# Patient Record
Sex: Male | Born: 1990 | Race: Black or African American | Hispanic: No | Marital: Single | Smoking: Never smoker
Health system: Southern US, Community
[De-identification: ages and names within clinical notes are randomized; demographics above are authoritative.]

---
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
## 2009-03-05 ENCOUNTER — Ambulatory Visit (HOSPITAL_BASED_OUTPATIENT_CLINIC_OR_DEPARTMENT_OTHER): Admission: RE | Admit: 2009-03-05 | Discharge: 2009-03-05 | Payer: Self-pay | Admitting: Ophthalmology

## 2009-10-17 IMAGING — CR DG ANKLE COMPLETE 3+V*L*
3 series · 3 of 3 positions shown · non-contrast
Comparison: Ankle films 04/26/2004.

CLINICAL DATA: Injured, playing football.  Lateral ankle pain

LEFT ANKLE COMPLETE - 3+ VIEW

[view not recorded (1 of 3)]
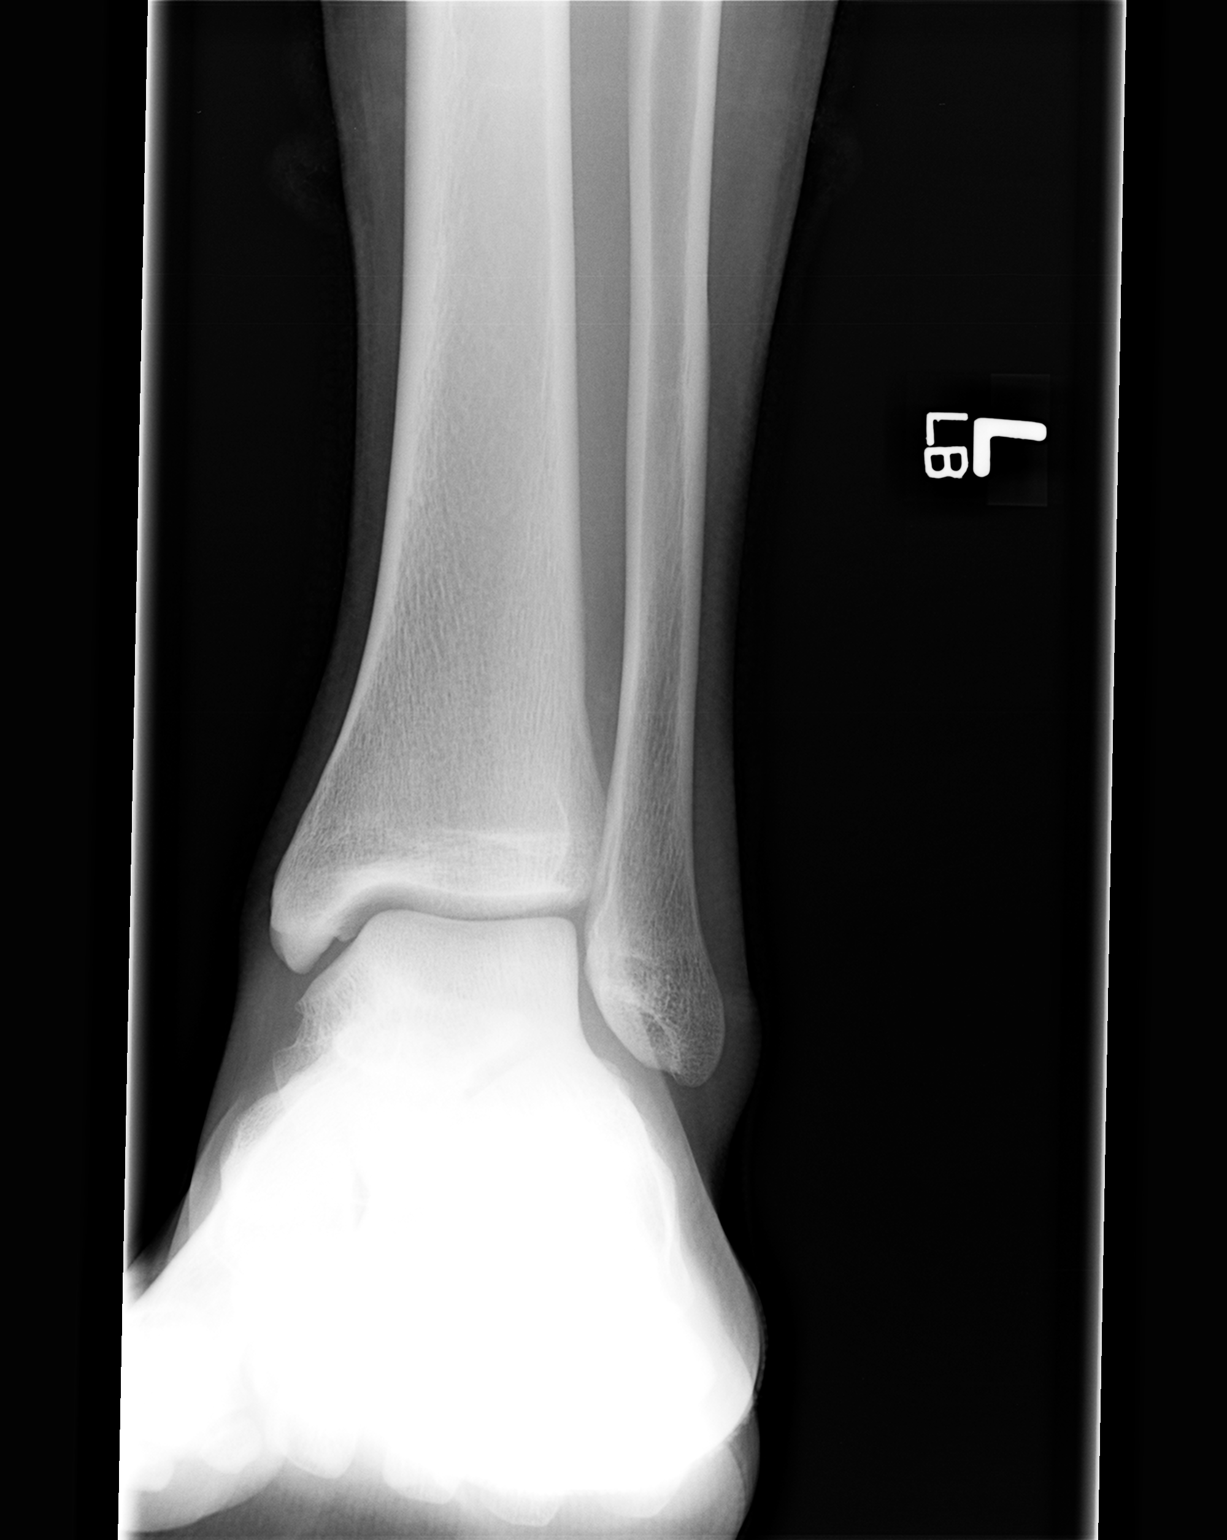

[view not recorded (2 of 3)]
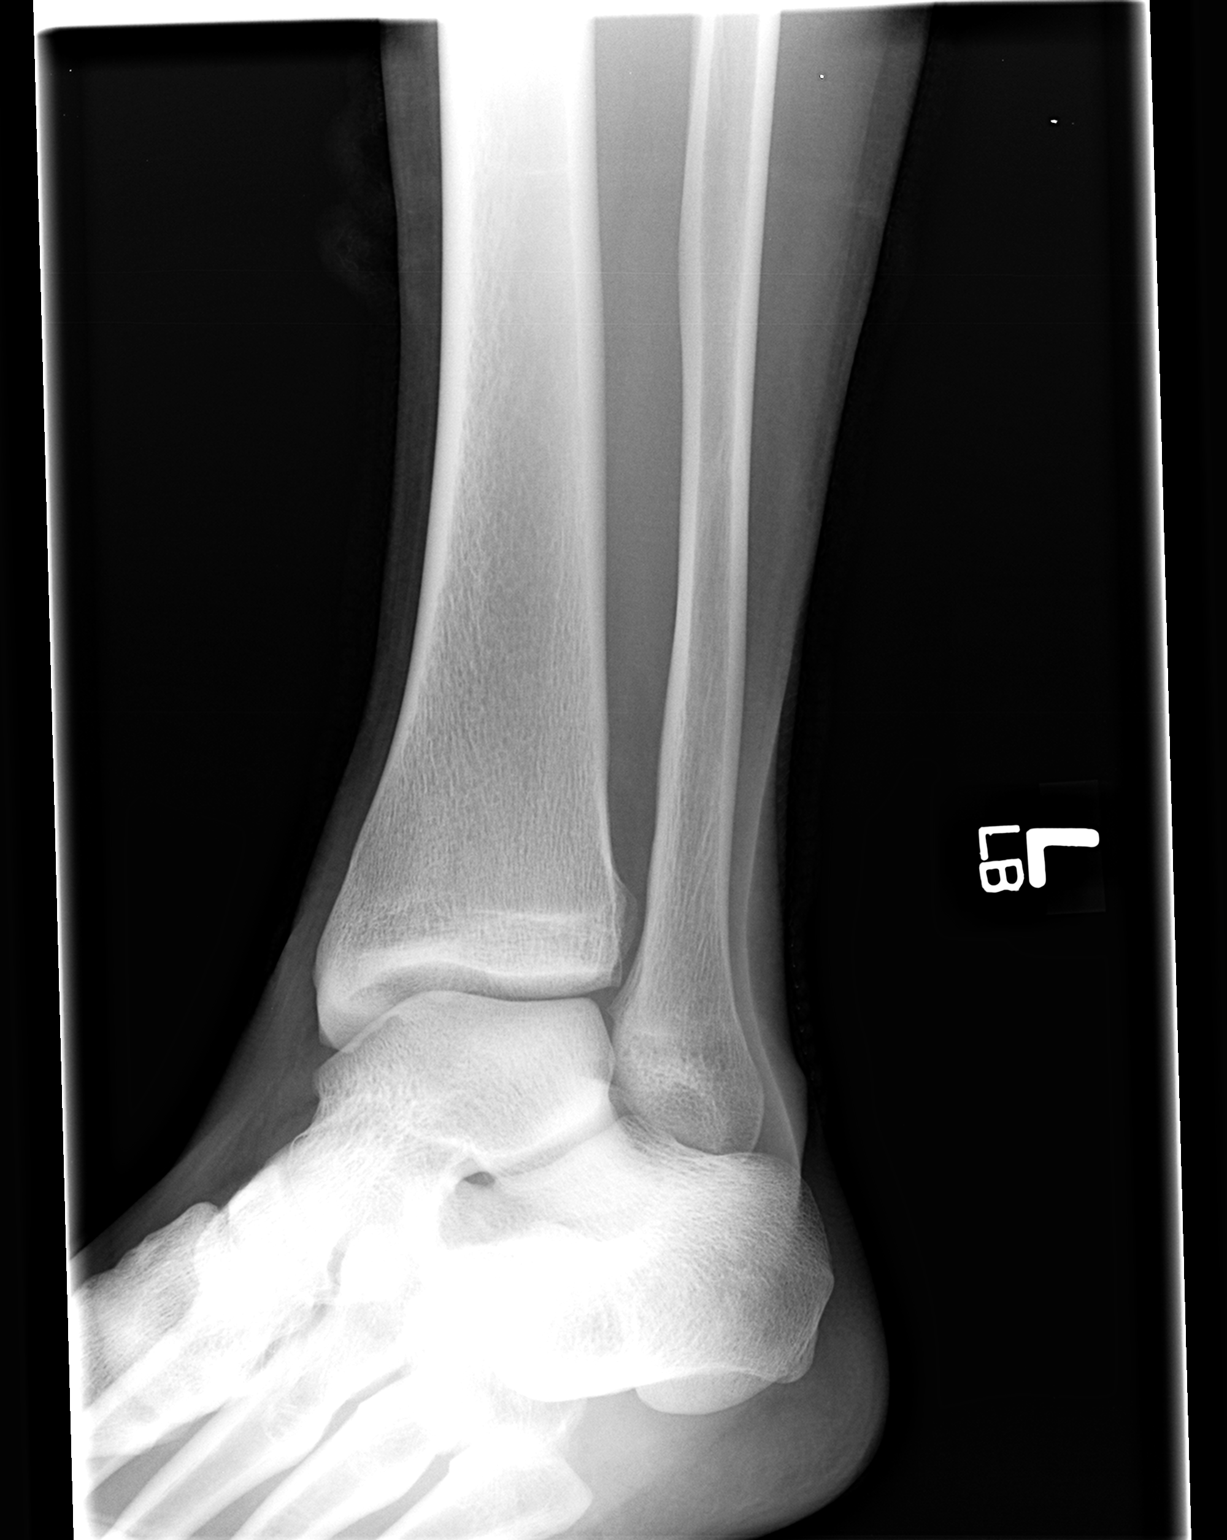

[view not recorded (3 of 3)]
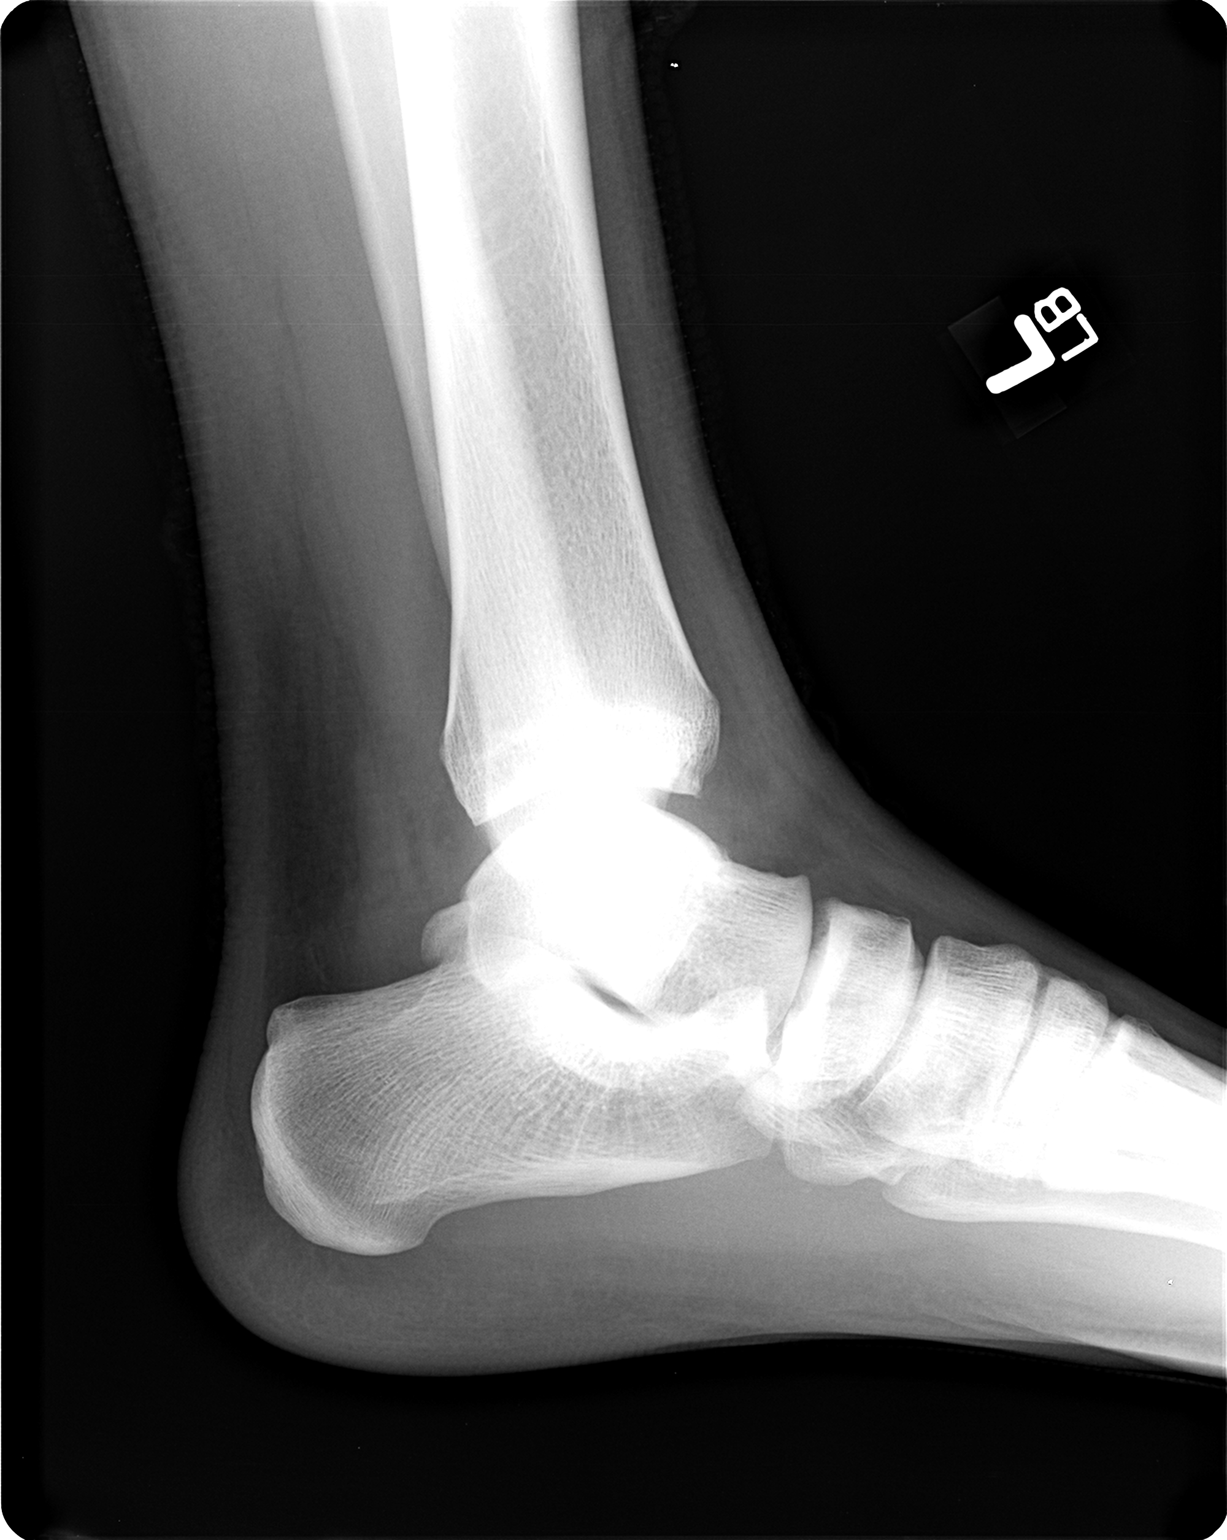

[3 of 3 positions shown; findings below may reference images not displayed]

FINDINGS: No fracture or dislocation.  Minimal lateral soft tissue
swelling.  No radiopaque foreign body.  Little change from priors.
IMPRESSION: No acute bony abnormality.  No visible fracture.  Recommend repeat
films if pain persists.

## 2010-09-22 ENCOUNTER — Emergency Department (HOSPITAL_COMMUNITY)
Admission: EM | Admit: 2010-09-22 | Discharge: 2010-09-22 | Discharge status: Against Medical Advice | Payer: Self-pay | Attending: *Deleted | Admitting: *Deleted

## 2010-09-22 ENCOUNTER — Encounter: Payer: Self-pay | Admitting: *Deleted

## 2010-09-22 DIAGNOSIS — R51 Headache: Secondary | ICD-10-CM | POA: Insufficient documentation

## 2010-09-22 DIAGNOSIS — R42 Dizziness and giddiness: Secondary | ICD-10-CM | POA: Insufficient documentation

## 2010-09-22 DIAGNOSIS — Z5320 Procedure and treatment not carried out because of patient's decision for unspecified reasons: Secondary | ICD-10-CM | POA: Insufficient documentation

## 2010-09-22 DIAGNOSIS — R5381 Other malaise: Secondary | ICD-10-CM | POA: Insufficient documentation

## 2010-09-22 NOTE — ED Notes (Signed)
Pt c/o headache, dizziness, fatigued x 1day. Denies n/v or any other symptoms.

## 2013-01-25 ENCOUNTER — Ambulatory Visit (INDEPENDENT_AMBULATORY_CARE_PROVIDER_SITE_OTHER): Payer: BC Managed Care – PPO | Admitting: Orthopedic Surgery

## 2013-01-25 ENCOUNTER — Ambulatory Visit (INDEPENDENT_AMBULATORY_CARE_PROVIDER_SITE_OTHER): Payer: BC Managed Care – PPO

## 2013-01-25 VITALS — Ht 71.0 in | Wt 192.0 lb

## 2013-01-25 DIAGNOSIS — M25519 Pain in unspecified shoulder: Secondary | ICD-10-CM

## 2013-01-25 DIAGNOSIS — M25511 Pain in right shoulder: Secondary | ICD-10-CM
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
## 2013-01-25 NOTE — Patient Instructions (Signed)
Exercises   Grade 2 AC separation

## 2016-01-14 ENCOUNTER — Emergency Department (HOSPITAL_COMMUNITY): Payer: Self-pay

## 2016-01-14 ENCOUNTER — Emergency Department (HOSPITAL_COMMUNITY)
Admission: EM | Admit: 2016-01-14 | Discharge: 2016-01-14 | Disposition: A | Discharge status: Home / Self Care | Payer: Self-pay | Attending: Emergency Medicine | Admitting: Emergency Medicine

## 2016-01-14 ENCOUNTER — Encounter (HOSPITAL_COMMUNITY): Payer: Self-pay

## 2016-01-14 DIAGNOSIS — Y929 Unspecified place or not applicable: Secondary | ICD-10-CM | POA: Insufficient documentation

## 2016-01-14 DIAGNOSIS — M25562 Pain in left knee: Secondary | ICD-10-CM | POA: Insufficient documentation

## 2016-01-14 DIAGNOSIS — Y9367 Activity, basketball: Secondary | ICD-10-CM | POA: Insufficient documentation

## 2016-01-14 DIAGNOSIS — Y998 Other external cause status: Secondary | ICD-10-CM | POA: Insufficient documentation

## 2016-01-14 DIAGNOSIS — X58XXXA Exposure to other specified factors, initial encounter: Secondary | ICD-10-CM | POA: Insufficient documentation

## 2016-01-14 MED ORDER — IBUPROFEN 400 MG PO TABS: 400.0000 mg | ORAL_TABLET | Freq: Four times a day (QID) | ORAL | 0 refills | Status: DC | PRN

## 2016-01-14 MED ORDER — IBUPROFEN 400 MG PO TABS
400.0000 mg | ORAL_TABLET | Freq: Four times a day (QID) | ORAL | 0 refills | Status: AC | PRN
Start: 2016-01-14 — End: ?

## 2016-01-14 NOTE — ED Triage Notes (Signed)
Pt reports left knee pain that started while playing basketball yesterday.

## 2016-01-14 NOTE — ED Notes (Signed)
Pt returned from xray

## 2016-01-14 NOTE — Discharge Instructions (Signed)
You were seen today for knee pain. Your knee pain is likely from a ligament strain or meniscal pain. Take Ibuprofen 400 mg every 6 hours as needed for pain. You can ice your knee 3 times a day for 10 minutes at a time for pain relief. You will also need to take a break from sports until the knee feels better. If you'd like you can also wrap your knee in an ACE bandage. Take care!

## 2016-01-14 NOTE — ED Provider Notes (Signed)
[facility] Provider Note   CSN: [id number] Arrival date & time: 01/14/16  [id number]   History   Chief Complaint Chief Complaint  Patient presents with  . Knee Pain   HPI   Vincent Ford BornHoughton is a 25 y.o. male with no significant PMH who presents with 1 day hx of left knee pain. Patient was playing basketball and was running, he almost ran into a wall and abruptly stopped. He thinks he hyperextended his left knee. Denies fevers, any popping, locking, tenderness, or erythema or the left knee joint. Denies hitting his knee on anything or any significant trauma. It is painful for him to walk on his left knee. The most painful movements for him include flexing his knee. Has never had any knee injuries or surgeries in the past.   History reviewed. No pertinent past medical history.  Patient Active Problem List   Diagnosis Date Noted  . OTHER ANKLE SPRAIN AND STRAIN 12/26/2007  . KNEE PAIN 10/12/2007  . SPRAIN, ACROMIOCLAVICULAR 12/15/2006    History reviewed. No pertinent surgical history.   Home Medications    Prior to Admission medications   Medication Sig Start Date End Date Taking? Authorizing Provider  ibuprofen (ADVIL,MOTRIN) 400 MG tablet Take 1 tablet (400 mg total) by mouth every 6 (six) hours as needed. 01/14/16   Beaulah Dinninghristina M Gambino, MD    Family History No family history on file.  Social History Social History  Substance Use Topics  . Smoking status: Never Smoker  . Smokeless tobacco: Never Used  . Alcohol use No     Allergies   Patient has no known allergies.   Review of Systems Review of Systems  10 Systems reviewed and are negative for acute change except as noted in the HPI.   Physical Exam Updated Vital Signs BP 139/81 (BP Location: Left Arm)   Pulse 66   Temp 97.8 F (36.6 C) (Oral)   Resp 18   Ht 5\' 11"  (1.803 m)   Wt 86.2 kg   SpO2 96%   BMI 26.50 kg/m   Physical Exam  Constitutional: He appears well-developed and well-nourished.   HENT:  Head: Normocephalic and atraumatic.  Right Ear: External ear normal.  Left Ear: External ear normal.  Nose: Nose normal.  Mouth/Throat: Oropharynx is clear and moist.  Eyes: Conjunctivae are normal. Pupils are equal, round, and reactive to light.  Neck: Normal range of motion.  Cardiovascular: Normal rate and intact distal pulses.   Pulmonary/Chest: Effort normal. No respiratory distress.  Abdominal: He exhibits no distension.  Musculoskeletal:       Right hip: Normal.       Left hip: Normal.       Right knee: Normal.       Left knee: He exhibits normal range of motion, no swelling, no effusion, no ecchymosis, no deformity, no laceration, no erythema, normal alignment, no LCL laxity, normal patellar mobility, no bony tenderness, normal meniscus and no MCL laxity. Tenderness found. Medial joint line tenderness noted. No lateral joint line, no MCL, no LCL and no patellar tendon tenderness noted.  Tenderness to palpation of posterior knee, no obvious baker's cyst     ED Treatments / Results  Labs (all labs ordered are listed, but only abnormal results are displayed) Labs Reviewed - No data to display  EKG  EKG Interpretation None       Radiology Dg Knee Complete 4 Views Left  Result Date: 01/14/2016 CLINICAL DATA:  Left knee pain after basketball  injury yesterday. EXAM: LEFT KNEE - COMPLETE 4+ VIEW COMPARISON:  None. FINDINGS: No evidence of fracture, dislocation, or joint effusion. No evidence of arthropathy or other focal bone abnormality. Soft tissues are unremarkable. IMPRESSION: Normal left knee. Electronically Signed   By: Lupita RaiderJames  Green Jr, M.D.   On: 01/14/2016 09:14    Procedures Procedures (including critical care time)  Medications Ordered in ED Medications - No data to display   Initial Impression / Assessment and Plan / ED Course  I have reviewed the triage vital signs and the nursing notes.   Pertinent labs & imaging results that were available  during my care of the patient were reviewed by me and considered in my medical decision making (see chart for details).  Clinical Course    Patient was seen for left knee pain in the setting of playing basketball yesterday. XRAY normal. No signs of septic joint, gout, acute fracture. Pain likely medial ligament vs hamstring strain. Advised RICE therapy and Ibuprofen 400 mg q6 PRN. Follow up with PCP.  Final Clinical Impressions(s) / ED Diagnoses   Final diagnoses:  Acute pain of left knee    New Prescriptions Current Discharge Medication List    START taking these medications   Details  ibuprofen (ADVIL,MOTRIN) 400 MG tablet Take 1 tablet (400 mg total) by mouth every 6 (six) hours as needed. Qty: 30 tablet, Refills: 0         Beaulah Dinninghristina M Gambino, MD 01/14/16 [id number]    Raeford RazorStephen Kohut, MD 01/18/16 1044

## 2017-11-07 IMAGING — DX DG KNEE COMPLETE 4+V*L*
4 series · 4 of 4 positions shown · non-contrast
Comparison: None.

CLINICAL DATA: Left knee pain after basketball injury yesterday.

EXAM:
LEFT KNEE - COMPLETE 4+ VIEW

[knee ap]
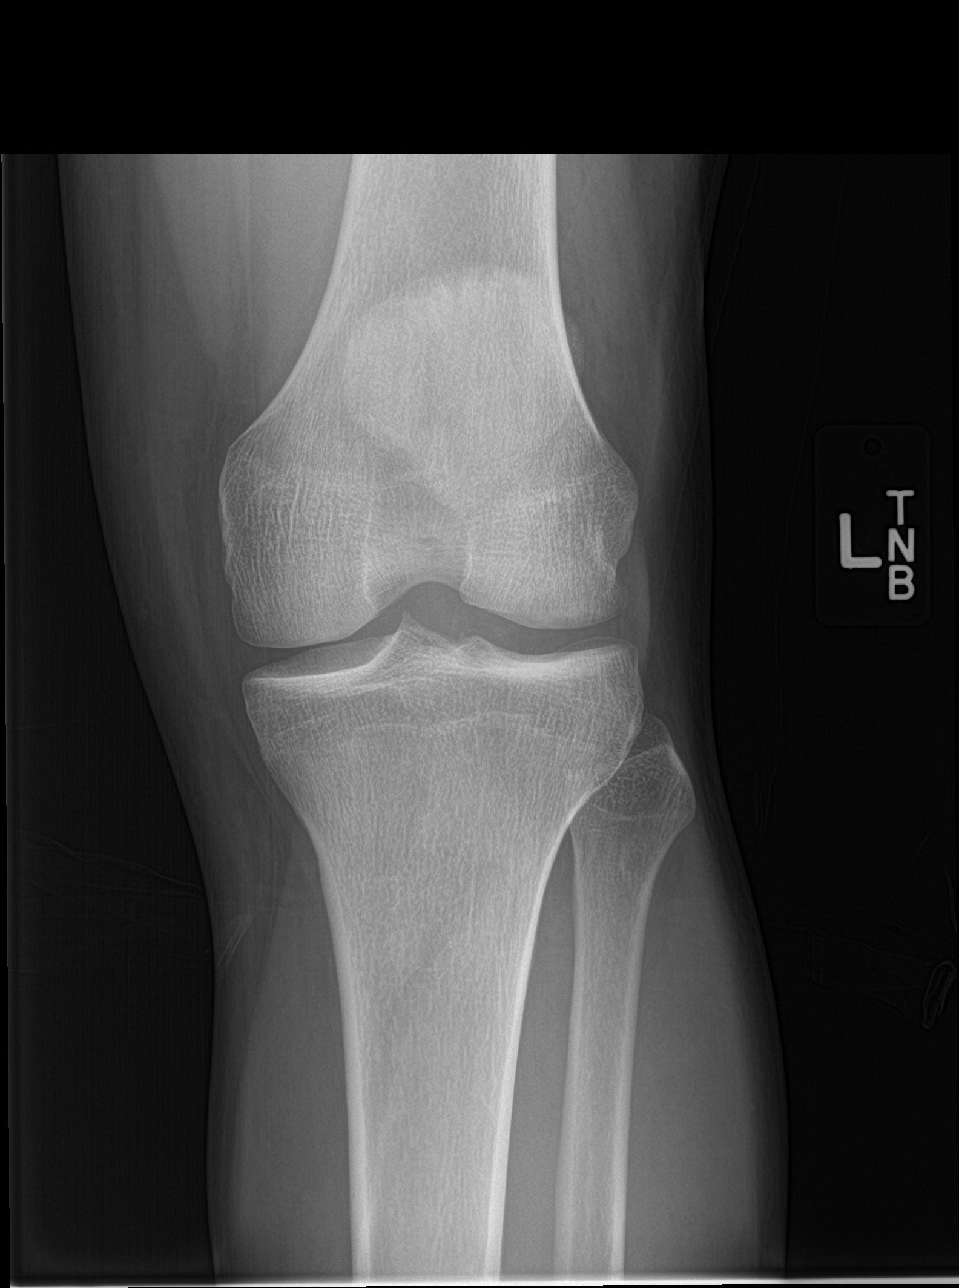

[tunnel]
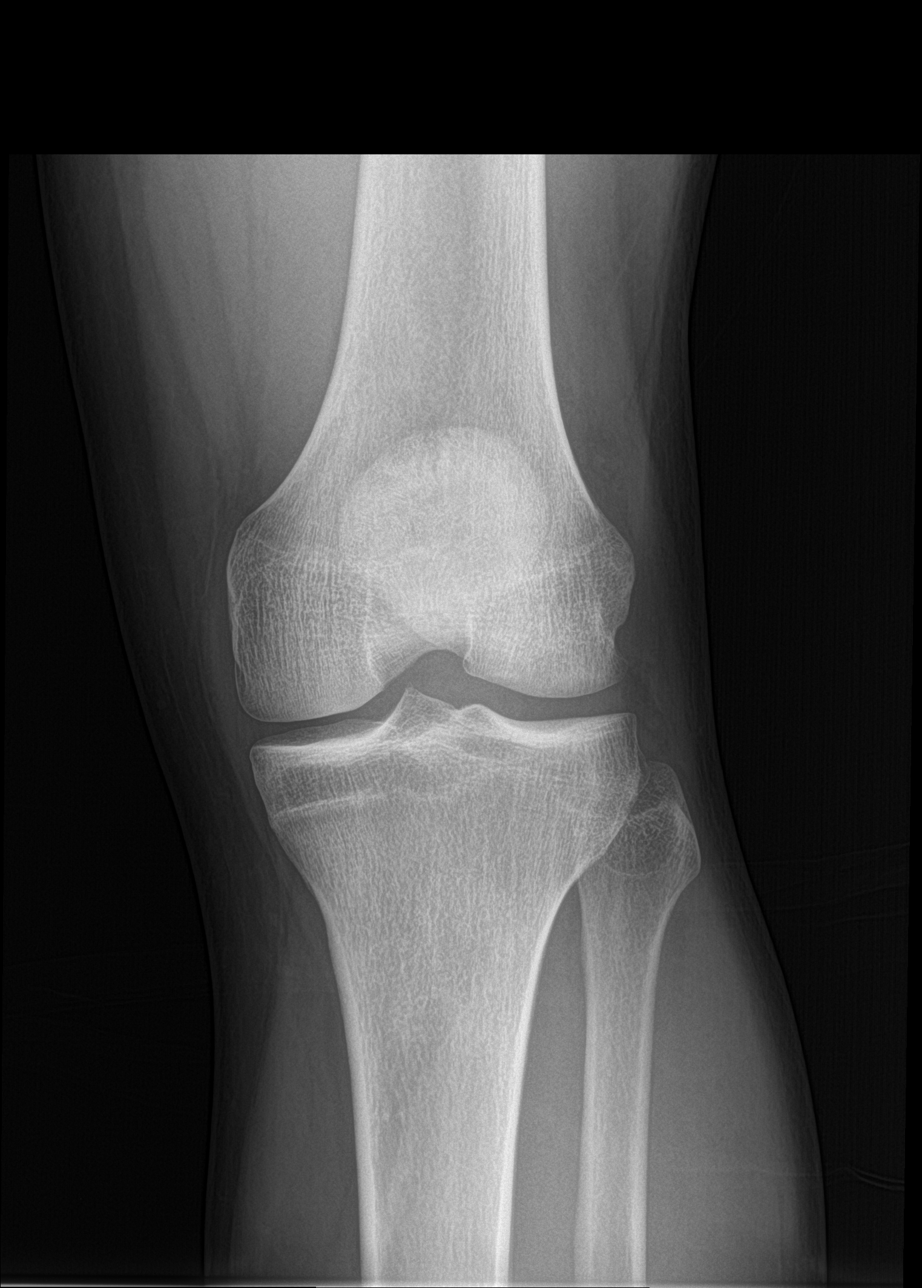

[knee lat]
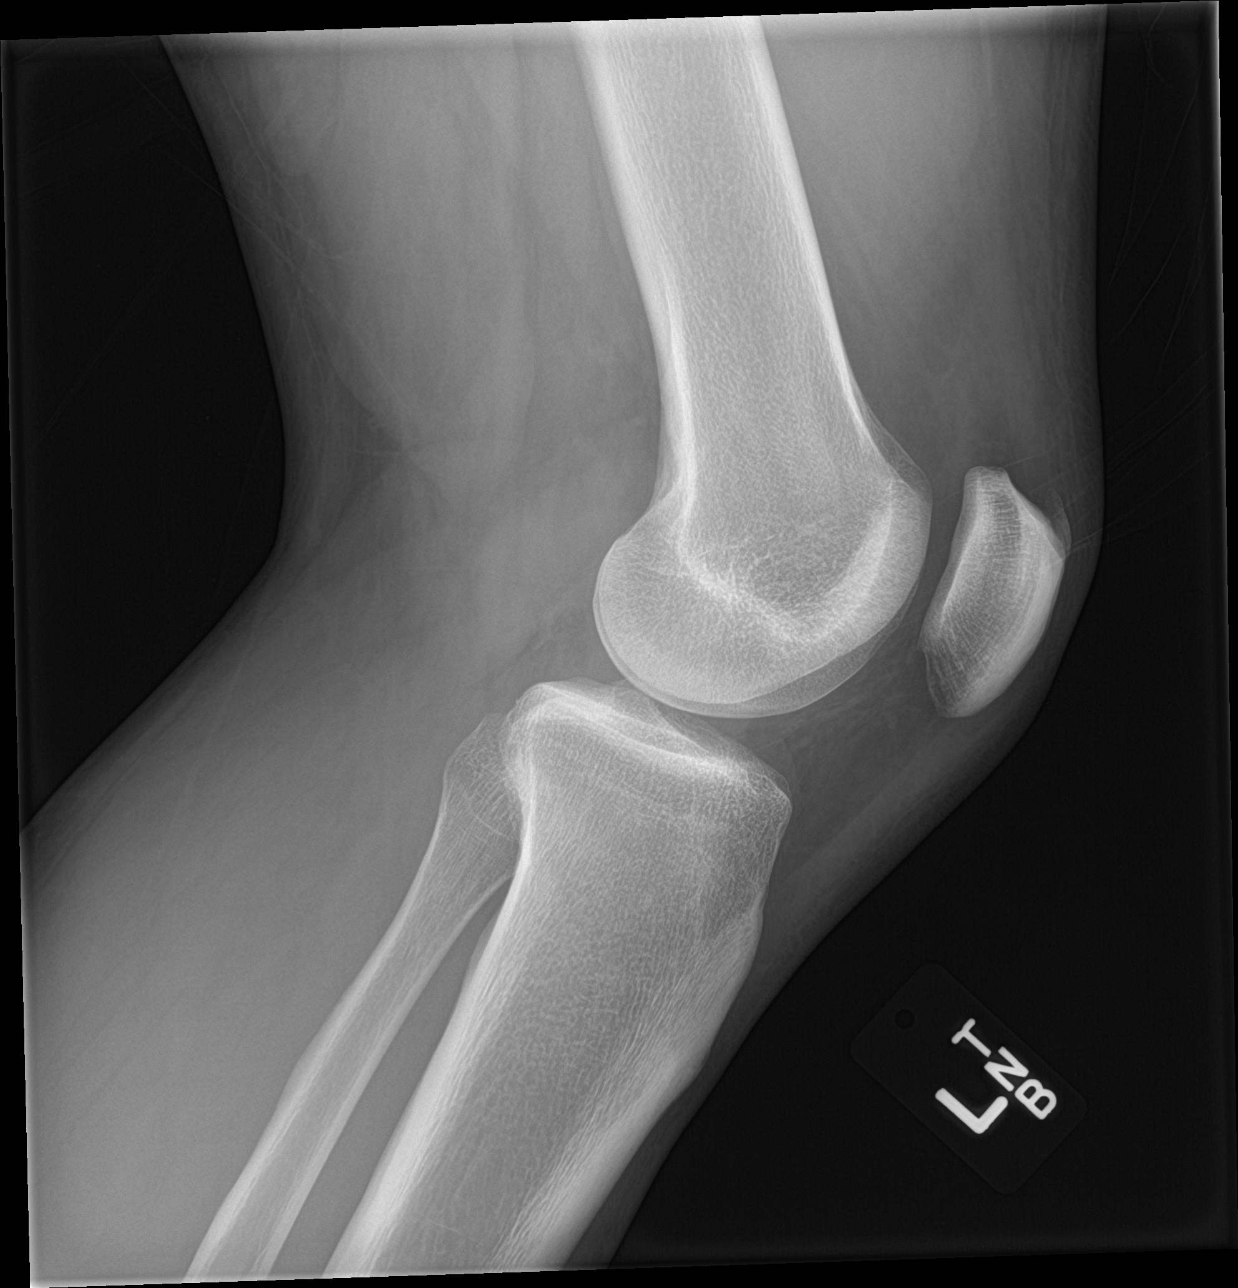

[knee sunrise]
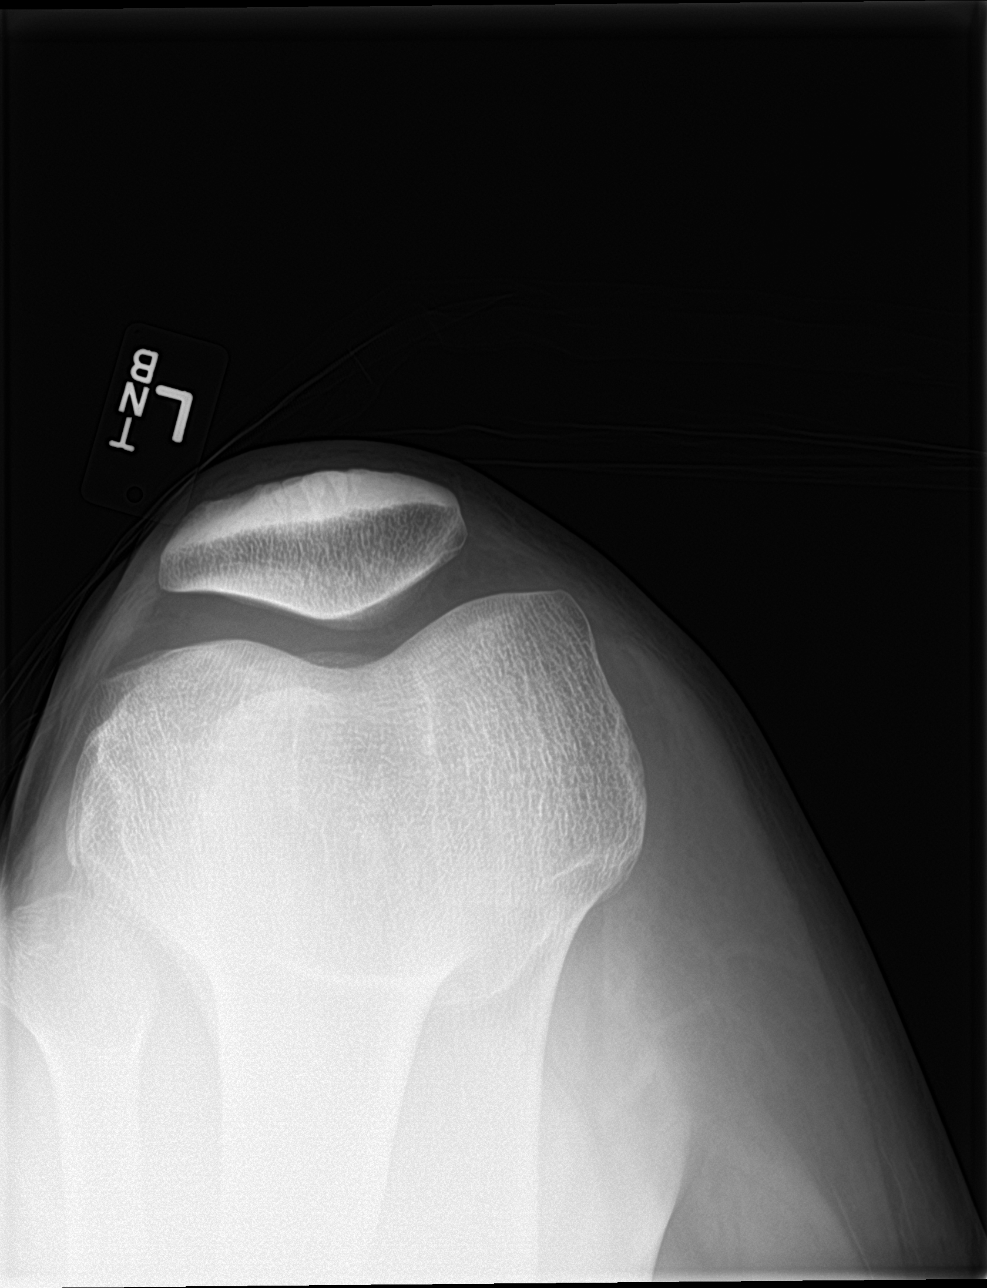

[4 of 4 positions shown; findings below may reference images not displayed]

FINDINGS: No evidence of fracture, dislocation, or joint effusion. No evidence
of arthropathy or other focal bone abnormality. Soft tissues are
unremarkable.
IMPRESSION: Normal left knee.

## 2020-06-23 ENCOUNTER — Ambulatory Visit (HOSPITAL_COMMUNITY)
Admission: EM | Admit: 2020-06-23 | Discharge: 2020-06-23 | Disposition: A | Discharge status: Home / Self Care | Payer: Self-pay | Attending: Family Medicine | Admitting: Family Medicine

## 2020-06-23 ENCOUNTER — Other Ambulatory Visit: Payer: Self-pay

## 2020-06-23 ENCOUNTER — Encounter (HOSPITAL_COMMUNITY): Payer: Self-pay | Admitting: Emergency Medicine

## 2020-06-23 DIAGNOSIS — S161XXA Strain of muscle, fascia and tendon at neck level, initial encounter: Secondary | ICD-10-CM

## 2020-06-23 DIAGNOSIS — S39012A Strain of muscle, fascia and tendon of lower back, initial encounter: Secondary | ICD-10-CM

## 2020-06-23 MED ORDER — CYCLOBENZAPRINE HCL 5 MG PO TABS: 5.0000 mg | ORAL_TABLET | Freq: Two times a day (BID) | ORAL | 0 refills | Status: AC | PRN

## 2020-06-23 MED ORDER — NAPROXEN 500 MG PO TABS: 500.0000 mg | ORAL_TABLET | Freq: Two times a day (BID) | ORAL | 0 refills | Status: AC | PRN
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
